# Patient Record
Sex: Male | Born: 1990
Health system: Southern US, Community
[De-identification: ages and names within clinical notes are randomized; demographics above are authoritative.]

## PROBLEM LIST (undated history)

## (undated) DIAGNOSIS — Z87820 Personal history of traumatic brain injury: Secondary | ICD-10-CM

## (undated) DIAGNOSIS — I82409 Acute embolism and thrombosis of unspecified deep veins of unspecified lower extremity: Secondary | ICD-10-CM

---
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
## 2016-07-16 ENCOUNTER — Emergency Department (HOSPITAL_COMMUNITY): Payer: No Typology Code available for payment source

## 2016-07-16 ENCOUNTER — Inpatient Hospital Stay (HOSPITAL_COMMUNITY)
Admission: EM | Admit: 2016-07-16 | Discharge: 2016-07-29 | DRG: 481 | Disposition: A | Discharge status: Home / Self Care | Payer: No Typology Code available for payment source | Attending: Orthopedic Surgery | Admitting: Orthopedic Surgery

## 2016-07-16 ENCOUNTER — Encounter (HOSPITAL_COMMUNITY): Payer: Self-pay | Admitting: *Deleted

## 2016-07-16 DIAGNOSIS — T1490XA Injury, unspecified, initial encounter: Secondary | ICD-10-CM

## 2016-07-16 DIAGNOSIS — M25462 Effusion, left knee: Secondary | ICD-10-CM | POA: Diagnosis present

## 2016-07-16 DIAGNOSIS — S7290XA Unspecified fracture of unspecified femur, initial encounter for closed fracture: Secondary | ICD-10-CM

## 2016-07-16 DIAGNOSIS — R52 Pain, unspecified: Secondary | ICD-10-CM

## 2016-07-16 DIAGNOSIS — S72352A Displaced comminuted fracture of shaft of left femur, initial encounter for closed fracture: Secondary | ICD-10-CM | POA: Diagnosis present

## 2016-07-16 DIAGNOSIS — I82621 Acute embolism and thrombosis of deep veins of right upper extremity: Secondary | ICD-10-CM | POA: Diagnosis present

## 2016-07-16 DIAGNOSIS — T148XXA Other injury of unspecified body region, initial encounter: Secondary | ICD-10-CM

## 2016-07-16 DIAGNOSIS — F17210 Nicotine dependence, cigarettes, uncomplicated: Secondary | ICD-10-CM | POA: Diagnosis present

## 2016-07-16 DIAGNOSIS — S069XAA Unspecified intracranial injury with loss of consciousness status unknown, initial encounter: Secondary | ICD-10-CM | POA: Diagnosis present

## 2016-07-16 DIAGNOSIS — R000 Tachycardia, unspecified: Secondary | ICD-10-CM | POA: Diagnosis present

## 2016-07-16 DIAGNOSIS — S76112A Strain of left quadriceps muscle, fascia and tendon, initial encounter: Secondary | ICD-10-CM | POA: Diagnosis present

## 2016-07-16 DIAGNOSIS — S7292XA Unspecified fracture of left femur, initial encounter for closed fracture: Secondary | ICD-10-CM

## 2016-07-16 DIAGNOSIS — Y92410 Unspecified street and highway as the place of occurrence of the external cause: Secondary | ICD-10-CM

## 2016-07-16 DIAGNOSIS — S069X9A Unspecified intracranial injury with loss of consciousness of unspecified duration, initial encounter: Secondary | ICD-10-CM | POA: Diagnosis present

## 2016-07-16 DIAGNOSIS — S7222XA Displaced subtrochanteric fracture of left femur, initial encounter for closed fracture: Secondary | ICD-10-CM | POA: Diagnosis not present

## 2016-07-16 DIAGNOSIS — Z86718 Personal history of other venous thrombosis and embolism: Secondary | ICD-10-CM

## 2016-07-16 DIAGNOSIS — M25552 Pain in left hip: Secondary | ICD-10-CM

## 2016-07-16 DIAGNOSIS — Z833 Family history of diabetes mellitus: Secondary | ICD-10-CM

## 2016-07-16 DIAGNOSIS — M25522 Pain in left elbow: Secondary | ICD-10-CM | POA: Diagnosis present

## 2016-07-16 DIAGNOSIS — I82409 Acute embolism and thrombosis of unspecified deep veins of unspecified lower extremity: Secondary | ICD-10-CM | POA: Diagnosis present

## 2016-07-16 DIAGNOSIS — M25529 Pain in unspecified elbow: Secondary | ICD-10-CM

## 2016-07-16 DIAGNOSIS — R339 Retention of urine, unspecified: Secondary | ICD-10-CM | POA: Diagnosis present

## 2016-07-16 DIAGNOSIS — D62 Acute posthemorrhagic anemia: Secondary | ICD-10-CM | POA: Diagnosis not present

## 2016-07-16 DIAGNOSIS — M25569 Pain in unspecified knee: Secondary | ICD-10-CM

## 2016-07-16 HISTORY — DX: Acute embolism and thrombosis of unspecified deep veins of unspecified lower extremity: I82.409

## 2016-07-16 HISTORY — DX: Personal history of traumatic brain injury: Z87.820

## 2016-07-16 LAB — I-STAT CG4 LACTIC ACID, ED: LACTIC ACID, VENOUS: 2.74 mmol/L — AB (ref 0.5–1.9)

## 2016-07-16 LAB — SAMPLE TO BLOOD BANK

## 2016-07-16 LAB — PROTIME-INR
INR: 1.05
Prothrombin Time: 13.7 seconds (ref 11.4–15.2)

## 2016-07-16 LAB — CDS SEROLOGY

## 2016-07-16 LAB — ETHANOL: Alcohol, Ethyl (B): <5 mg/dL (ref <5)

## 2016-07-16 MED ORDER — HYDROMORPHONE HCL 1 MG/ML IJ SOLN
INTRAMUSCULAR | Status: AC
Start: 2016-07-16 — End: 2016-07-16
  Administered 2016-07-16: 1 mg
  Filled 2016-07-16: qty 1

## 2016-07-16 MED ORDER — HYDROMORPHONE HCL 1 MG/ML IJ SOLN
INTRAMUSCULAR | Status: AC
  Filled 2016-07-16: qty 1

## 2016-07-16 MED ORDER — IOPAMIDOL (ISOVUE-300) INJECTION 61%
INTRAVENOUS | Status: AC
  Administered 2016-07-16: 100 mL
  Filled 2016-07-16: qty 100

## 2016-07-16 MED ORDER — HYDROMORPHONE HCL 1 MG/ML IJ SOLN
1.0000 mg | Freq: Once | INTRAMUSCULAR | Status: AC
  Administered 2016-07-16: 1 mg via INTRAVENOUS
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
## 2016-07-16 NOTE — ED Notes (Signed)
Pt has returned from CT with this RN
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
## 2016-07-17 ENCOUNTER — Emergency Department (HOSPITAL_COMMUNITY): Payer: No Typology Code available for payment source

## 2016-07-17 ENCOUNTER — Inpatient Hospital Stay (HOSPITAL_COMMUNITY): Payer: No Typology Code available for payment source

## 2016-07-17 ENCOUNTER — Inpatient Hospital Stay (HOSPITAL_COMMUNITY): Payer: No Typology Code available for payment source | Admitting: Anesthesiology

## 2016-07-17 ENCOUNTER — Encounter (HOSPITAL_COMMUNITY): Admission: EM | Disposition: A | Discharge status: Home / Self Care | Payer: Self-pay | Source: Home / Self Care

## 2016-07-17 ENCOUNTER — Encounter (HOSPITAL_COMMUNITY): Payer: Self-pay | Admitting: Anesthesiology

## 2016-07-17 DIAGNOSIS — M25522 Pain in left elbow: Secondary | ICD-10-CM | POA: Diagnosis present

## 2016-07-17 DIAGNOSIS — M25552 Pain in left hip: Secondary | ICD-10-CM | POA: Diagnosis present

## 2016-07-17 DIAGNOSIS — Z833 Family history of diabetes mellitus: Secondary | ICD-10-CM | POA: Diagnosis not present

## 2016-07-17 DIAGNOSIS — I82621 Acute embolism and thrombosis of deep veins of right upper extremity: Secondary | ICD-10-CM | POA: Diagnosis present

## 2016-07-17 DIAGNOSIS — S7222XA Displaced subtrochanteric fracture of left femur, initial encounter for closed fracture: Secondary | ICD-10-CM | POA: Diagnosis present

## 2016-07-17 DIAGNOSIS — D62 Acute posthemorrhagic anemia: Secondary | ICD-10-CM | POA: Diagnosis not present

## 2016-07-17 DIAGNOSIS — M7989 Other specified soft tissue disorders: Secondary | ICD-10-CM | POA: Diagnosis not present

## 2016-07-17 DIAGNOSIS — S72352A Displaced comminuted fracture of shaft of left femur, initial encounter for closed fracture: Secondary | ICD-10-CM | POA: Diagnosis present

## 2016-07-17 DIAGNOSIS — F17210 Nicotine dependence, cigarettes, uncomplicated: Secondary | ICD-10-CM | POA: Diagnosis present

## 2016-07-17 DIAGNOSIS — M25462 Effusion, left knee: Secondary | ICD-10-CM | POA: Diagnosis present

## 2016-07-17 DIAGNOSIS — E871 Hypo-osmolality and hyponatremia: Secondary | ICD-10-CM | POA: Diagnosis not present

## 2016-07-17 DIAGNOSIS — S76112A Strain of left quadriceps muscle, fascia and tendon, initial encounter: Secondary | ICD-10-CM | POA: Diagnosis present

## 2016-07-17 DIAGNOSIS — Z86718 Personal history of other venous thrombosis and embolism: Secondary | ICD-10-CM | POA: Diagnosis not present

## 2016-07-17 DIAGNOSIS — S069X1S Unspecified intracranial injury with loss of consciousness of 30 minutes or less, sequela: Secondary | ICD-10-CM | POA: Diagnosis not present

## 2016-07-17 DIAGNOSIS — R339 Retention of urine, unspecified: Secondary | ICD-10-CM | POA: Diagnosis present

## 2016-07-17 DIAGNOSIS — R000 Tachycardia, unspecified: Secondary | ICD-10-CM | POA: Diagnosis present

## 2016-07-17 DIAGNOSIS — S72102S Unspecified trochanteric fracture of left femur, sequela: Secondary | ICD-10-CM | POA: Diagnosis not present

## 2016-07-17 DIAGNOSIS — Y92410 Unspecified street and highway as the place of occurrence of the external cause: Secondary | ICD-10-CM | POA: Diagnosis not present

## 2016-07-17 DIAGNOSIS — S72002G Fracture of unspecified part of neck of left femur, subsequent encounter for closed fracture with delayed healing: Secondary | ICD-10-CM | POA: Diagnosis not present

## 2016-07-17 HISTORY — PX: FEMUR IM NAIL: SHX1597

## 2016-07-17 LAB — I-STAT CHEM 8, ED
BUN: 16 mg/dL (ref 6–20)
CALCIUM ION: 1.08 mmol/L — AB (ref 1.15–1.40)
CHLORIDE: 103 mmol/L (ref 101–111)
Creatinine, Ser: 1 mg/dL (ref 0.61–1.24)
Glucose, Bld: 95 mg/dL (ref 65–99)
HEMATOCRIT: 53 % — AB (ref 39.0–52.0)
Hemoglobin: 18 g/dL — ABNORMAL HIGH (ref 13.0–17.0)
Potassium: 3.4 mmol/L — ABNORMAL LOW (ref 3.5–5.1)
SODIUM: 140 mmol/L (ref 135–145)
TCO2: 24 mmol/L (ref 0–100)

## 2016-07-17 LAB — URINALYSIS, ROUTINE W REFLEX MICROSCOPIC
Bilirubin Urine: NEGATIVE
GLUCOSE, UA: NEGATIVE mg/dL
Ketones, ur: NEGATIVE mg/dL
LEUKOCYTES UA: NEGATIVE
Nitrite: NEGATIVE
PH: 5.5 (ref 5.0–8.0)
Protein, ur: NEGATIVE mg/dL
Specific Gravity, Urine: 1.02 (ref 1.005–1.030)

## 2016-07-17 LAB — COMPREHENSIVE METABOLIC PANEL
ALBUMIN: 4.4 g/dL (ref 3.5–5.0)
ALT: 28 U/L (ref 17–63)
ANION GAP: 10 (ref 5–15)
AST: 42 U/L — ABNORMAL HIGH (ref 15–41)
Alkaline Phosphatase: 59 U/L (ref 38–126)
BUN: 14 mg/dL (ref 6–20)
CHLORIDE: 104 mmol/L (ref 101–111)
CO2: 24 mmol/L (ref 22–32)
CREATININE: 1.03 mg/dL (ref 0.61–1.24)
Calcium: 9.5 mg/dL (ref 8.9–10.3)
GFR calc non Af Amer: >60 mL/min (ref >60)
GLUCOSE: 95 mg/dL (ref 65–99)
Potassium: 3.7 mmol/L (ref 3.5–5.1)
SODIUM: 138 mmol/L (ref 135–145)
Total Bilirubin: 1 mg/dL (ref 0.3–1.2)
Total Protein: 7.6 g/dL (ref 6.5–8.1)

## 2016-07-17 LAB — SURGICAL PCR SCREEN
MRSA, PCR: NEGATIVE
STAPHYLOCOCCUS AUREUS: POSITIVE — AB

## 2016-07-17 LAB — CBC
HCT: 51.8 % (ref 39.0–52.0)
HEMOGLOBIN: 17.5 g/dL — AB (ref 13.0–17.0)
MCH: 30.7 pg (ref 26.0–34.0)
MCHC: 33.8 g/dL (ref 30.0–36.0)
MCV: 90.9 fL (ref 78.0–100.0)
Platelets: 254 10*3/uL (ref 150–400)
RBC: 5.7 MIL/uL (ref 4.22–5.81)
RDW: 13.4 % (ref 11.5–15.5)
WBC: 26 10*3/uL — ABNORMAL HIGH (ref 4.0–10.5)

## 2016-07-17 LAB — URINE MICROSCOPIC-ADD ON
RBC / HPF: NONE SEEN RBC/hpf (ref 0–5)
WBC, UA: NONE SEEN WBC/hpf (ref 0–5)

## 2016-07-17 SURGERY — INSERTION, INTRAMEDULLARY ROD, FEMUR
Anesthesia: General | Site: Leg Upper | Laterality: Left

## 2016-07-17 MED ORDER — SUCCINYLCHOLINE CHLORIDE 200 MG/10ML IV SOSY
PREFILLED_SYRINGE | INTRAVENOUS | Status: AC
  Filled 2016-07-17: qty 10

## 2016-07-17 MED ORDER — METOCLOPRAMIDE HCL 5 MG/ML IJ SOLN: 5.0000 mg | Freq: Three times a day (TID) | INTRAMUSCULAR | Status: DC | PRN

## 2016-07-17 MED ORDER — ENOXAPARIN SODIUM 40 MG/0.4ML ~~LOC~~ SOLN
40.0000 mg | SUBCUTANEOUS | Status: DC
  Administered 2016-07-18 – 2016-07-20 (×3): 40 mg via SUBCUTANEOUS
  Filled 2016-07-17 (×4): qty 0.4

## 2016-07-17 MED ORDER — ACETAMINOPHEN 325 MG PO TABS
650.0000 mg | ORAL_TABLET | ORAL | Status: DC | PRN
  Administered 2016-07-17 – 2016-07-21 (×5): 650 mg via ORAL
  Filled 2016-07-17 (×6): qty 2

## 2016-07-17 MED ORDER — METOCLOPRAMIDE HCL 5 MG PO TABS: 5.0000 mg | ORAL_TABLET | Freq: Three times a day (TID) | ORAL | Status: DC | PRN

## 2016-07-17 MED ORDER — PHENYLEPHRINE 40 MCG/ML (10ML) SYRINGE FOR IV PUSH (FOR BLOOD PRESSURE SUPPORT)
PREFILLED_SYRINGE | INTRAVENOUS | Status: AC
  Filled 2016-07-17: qty 10

## 2016-07-17 MED ORDER — OXYCODONE HCL 5 MG PO TABS
5.0000 mg | ORAL_TABLET | ORAL | Status: DC | PRN
  Administered 2016-07-17 (×2): 10 mg via ORAL
  Filled 2016-07-17 (×2): qty 2

## 2016-07-17 MED ORDER — DOCUSATE SODIUM 100 MG PO CAPS
100.0000 mg | ORAL_CAPSULE | Freq: Two times a day (BID) | ORAL | Status: DC
  Administered 2016-07-17 – 2016-07-26 (×19): 100 mg via ORAL
  Filled 2016-07-17 (×19): qty 1

## 2016-07-17 MED ORDER — HYDROMORPHONE HCL 1 MG/ML IJ SOLN
0.5000 mg | INTRAMUSCULAR | Status: DC | PRN
  Administered 2016-07-17 (×2): 1 mg via INTRAVENOUS
  Administered 2016-07-17: 2 mg via INTRAVENOUS
  Administered 2016-07-17 (×3): 1 mg via INTRAVENOUS
  Administered 2016-07-17 – 2016-07-18 (×2): 2 mg via INTRAVENOUS
  Administered 2016-07-18: 1 mg via INTRAVENOUS
  Administered 2016-07-18 (×2): 2 mg via INTRAVENOUS
  Filled 2016-07-17 (×3): qty 1
  Filled 2016-07-17 (×2): qty 2
  Filled 2016-07-17: qty 1
  Filled 2016-07-17: qty 2
  Filled 2016-07-17: qty 1
  Filled 2016-07-17 (×2): qty 2
  Filled 2016-07-17: qty 1

## 2016-07-17 MED ORDER — LIDOCAINE 2% (20 MG/ML) 5 ML SYRINGE
INTRAMUSCULAR | Status: AC
  Filled 2016-07-17: qty 5

## 2016-07-17 MED ORDER — HYDROCODONE-ACETAMINOPHEN 7.5-325 MG PO TABS: 1.0000 | ORAL_TABLET | Freq: Once | ORAL | Status: DC | PRN

## 2016-07-17 MED ORDER — KETAMINE HCL-SODIUM CHLORIDE 100-0.9 MG/10ML-% IV SOSY
0.3000 mg/kg | PREFILLED_SYRINGE | Freq: Once | INTRAVENOUS | Status: AC
  Administered 2016-07-17: 19 mg via INTRAVENOUS
  Filled 2016-07-17: qty 10

## 2016-07-17 MED ORDER — PHENYLEPHRINE HCL 10 MG/ML IJ SOLN
INTRAMUSCULAR | Status: DC | PRN
  Administered 2016-07-17 (×4): 40 ug via INTRAVENOUS
  Administered 2016-07-17: 80 ug via INTRAVENOUS
  Administered 2016-07-17: 40 ug via INTRAVENOUS

## 2016-07-17 MED ORDER — BISACODYL 10 MG RE SUPP
10.0000 mg | Freq: Every day | RECTAL | Status: DC | PRN
  Filled 2016-07-17: qty 1

## 2016-07-17 MED ORDER — MIDAZOLAM HCL 5 MG/5ML IJ SOLN
INTRAMUSCULAR | Status: DC | PRN
  Administered 2016-07-17: 1 mg via INTRAVENOUS

## 2016-07-17 MED ORDER — LIDOCAINE HCL (CARDIAC) 20 MG/ML IV SOLN
INTRAVENOUS | Status: DC | PRN
  Administered 2016-07-17: 60 mg via INTRAVENOUS

## 2016-07-17 MED ORDER — ONDANSETRON HCL 4 MG PO TABS: 4.0000 mg | ORAL_TABLET | Freq: Four times a day (QID) | ORAL | Status: DC | PRN

## 2016-07-17 MED ORDER — PHENOL 1.4 % MT LIQD: 1.0000 | OROMUCOSAL | Status: DC | PRN

## 2016-07-17 MED ORDER — ONDANSETRON HCL 4 MG/2ML IJ SOLN
INTRAMUSCULAR | Status: DC | PRN
  Administered 2016-07-17: 4 mg via INTRAVENOUS

## 2016-07-17 MED ORDER — KCL IN DEXTROSE-NACL 20-5-0.45 MEQ/L-%-% IV SOLN
INTRAVENOUS | Status: DC
  Administered 2016-07-17 – 2016-07-18 (×4): via INTRAVENOUS
  Filled 2016-07-17 (×3): qty 1000

## 2016-07-17 MED ORDER — FENTANYL CITRATE (PF) 100 MCG/2ML IJ SOLN
INTRAMUSCULAR | Status: AC
  Filled 2016-07-17: qty 4

## 2016-07-17 MED ORDER — CEFAZOLIN SODIUM-DEXTROSE 2-4 GM/100ML-% IV SOLN
2.0000 g | Freq: Four times a day (QID) | INTRAVENOUS | Status: AC
  Administered 2016-07-17 (×2): 2 g via INTRAVENOUS
  Filled 2016-07-17 (×2): qty 100

## 2016-07-17 MED ORDER — PROPOFOL 10 MG/ML IV BOLUS
INTRAVENOUS | Status: AC
  Filled 2016-07-17: qty 40

## 2016-07-17 MED ORDER — HYDROMORPHONE HCL 1 MG/ML IJ SOLN
1.0000 mg | Freq: Once | INTRAMUSCULAR | Status: AC
  Administered 2016-07-17: 1 mg via INTRAVENOUS
  Filled 2016-07-17: qty 1

## 2016-07-17 MED ORDER — ONDANSETRON HCL 4 MG/2ML IJ SOLN: 4.0000 mg | Freq: Four times a day (QID) | INTRAMUSCULAR | Status: DC | PRN

## 2016-07-17 MED ORDER — ROCURONIUM BROMIDE 10 MG/ML (PF) SYRINGE
PREFILLED_SYRINGE | INTRAVENOUS | Status: AC
  Filled 2016-07-17: qty 10

## 2016-07-17 MED ORDER — CHLORHEXIDINE GLUCONATE 4 % EX LIQD
60.0000 mL | Freq: Once | CUTANEOUS | Status: AC
  Administered 2016-07-17: 4 via TOPICAL
  Filled 2016-07-17 (×2): qty 60

## 2016-07-17 MED ORDER — CEFAZOLIN SODIUM-DEXTROSE 2-4 GM/100ML-% IV SOLN
2.0000 g | INTRAVENOUS | Status: AC
  Administered 2016-07-17: 2 g via INTRAVENOUS
  Filled 2016-07-17 (×2): qty 100

## 2016-07-17 MED ORDER — PROMETHAZINE HCL 25 MG/ML IJ SOLN: 6.2500 mg | INTRAMUSCULAR | Status: DC | PRN

## 2016-07-17 MED ORDER — SUGAMMADEX SODIUM 200 MG/2ML IV SOLN
INTRAVENOUS | Status: DC | PRN
  Administered 2016-07-17: 130 mg via INTRAVENOUS

## 2016-07-17 MED ORDER — HYDROMORPHONE HCL 1 MG/ML IJ SOLN
0.2500 mg | INTRAMUSCULAR | Status: DC | PRN
  Administered 2016-07-17 (×2): 0.5 mg via INTRAVENOUS

## 2016-07-17 MED ORDER — FENTANYL CITRATE (PF) 100 MCG/2ML IJ SOLN
INTRAMUSCULAR | Status: DC | PRN
  Administered 2016-07-17 (×2): 50 ug via INTRAVENOUS
  Administered 2016-07-17: 100 ug via INTRAVENOUS
  Administered 2016-07-17: 50 ug via INTRAVENOUS

## 2016-07-17 MED ORDER — ROCURONIUM BROMIDE 100 MG/10ML IV SOLN
INTRAVENOUS | Status: DC | PRN
  Administered 2016-07-17: 10 mg via INTRAVENOUS
  Administered 2016-07-17: 40 mg via INTRAVENOUS

## 2016-07-17 MED ORDER — SUCCINYLCHOLINE CHLORIDE 20 MG/ML IJ SOLN
INTRAMUSCULAR | Status: DC | PRN
  Administered 2016-07-17: 100 mg via INTRAVENOUS

## 2016-07-17 MED ORDER — HYDROMORPHONE HCL 1 MG/ML IJ SOLN
INTRAMUSCULAR | Status: AC
  Administered 2016-07-17: 1 mg
  Filled 2016-07-17: qty 1

## 2016-07-17 MED ORDER — PROPOFOL 10 MG/ML IV BOLUS
INTRAVENOUS | Status: DC | PRN
  Administered 2016-07-17: 160 mg via INTRAVENOUS

## 2016-07-17 MED ORDER — POVIDONE-IODINE 10 % EX SWAB: 2.0000 "application " | Freq: Once | CUTANEOUS | Status: DC

## 2016-07-17 MED ORDER — LACTATED RINGERS IV SOLN
INTRAVENOUS | Status: DC | PRN
  Administered 2016-07-17 (×2): via INTRAVENOUS

## 2016-07-17 MED ORDER — MENTHOL 3 MG MT LOZG
1.0000 | LOZENGE | OROMUCOSAL | Status: DC | PRN
  Filled 2016-07-17: qty 9

## 2016-07-17 MED ORDER — HYDROMORPHONE HCL 1 MG/ML IJ SOLN
INTRAMUSCULAR | Status: AC
  Administered 2016-07-17: 0.5 mg via INTRAVENOUS
  Filled 2016-07-17: qty 1

## 2016-07-17 MED ORDER — FENTANYL CITRATE (PF) 100 MCG/2ML IJ SOLN
INTRAMUSCULAR | Status: AC
  Filled 2016-07-17: qty 2

## 2016-07-17 MED ORDER — ONDANSETRON HCL 4 MG/2ML IJ SOLN
INTRAMUSCULAR | Status: AC
  Filled 2016-07-17: qty 2

## 2016-07-17 MED ORDER — CEFAZOLIN SODIUM-DEXTROSE 2-4 GM/100ML-% IV SOLN
2.0000 g | Freq: Every day | INTRAVENOUS | Status: DC
  Filled 2016-07-17: qty 100

## 2016-07-17 MED ORDER — SUGAMMADEX SODIUM 200 MG/2ML IV SOLN
INTRAVENOUS | Status: AC
  Filled 2016-07-17: qty 2

## 2016-07-17 MED ORDER — MIDAZOLAM HCL 2 MG/2ML IJ SOLN
INTRAMUSCULAR | Status: AC
  Filled 2016-07-17: qty 2

## 2016-07-17 SURGICAL SUPPLY — 48 items
BIT DRILL 3.8X6 NS (BIT) ×3 IMPLANT
BIT DRILL 5.3 (BIT) ×3 IMPLANT
BIT DRILL 6.5X4.8 (BIT) ×3 IMPLANT
BNDG COHESIVE 4X5 TAN STRL (GAUZE/BANDAGES/DRESSINGS) ×3 IMPLANT
BNDG GAUZE ELAST 4 BULKY (GAUZE/BANDAGES/DRESSINGS) ×3 IMPLANT
CHLORAPREP W/TINT 26ML (MISCELLANEOUS) ×3 IMPLANT
COVER PERINEAL POST (MISCELLANEOUS) ×3 IMPLANT
COVER SURGICAL LIGHT HANDLE (MISCELLANEOUS) ×3 IMPLANT
DRAPE INCISE IOBAN 66X45 STRL (DRAPES) ×3 IMPLANT
DRAPE STERI IOBAN 125X83 (DRAPES) ×3 IMPLANT
DRAPE SURG 17X23 STRL (DRAPES) ×12 IMPLANT
DRSG MEPILEX BORDER 4X4 (GAUZE/BANDAGES/DRESSINGS) ×6 IMPLANT
DRSG MEPILEX BORDER 4X8 (GAUZE/BANDAGES/DRESSINGS) ×3 IMPLANT
DRSG PAD ABDOMINAL 8X10 ST (GAUZE/BANDAGES/DRESSINGS) ×3 IMPLANT
ELECT REM PT RETURN 9FT ADLT (ELECTROSURGICAL) ×3
ELECTRODE REM PT RTRN 9FT ADLT (ELECTROSURGICAL) ×1 IMPLANT
GAUZE XEROFORM 5X9 LF (GAUZE/BANDAGES/DRESSINGS) ×3 IMPLANT
GLOVE BIO SURGEON STRL SZ7 (GLOVE) ×3 IMPLANT
GLOVE BIO SURGEON STRL SZ7.5 (GLOVE) ×3 IMPLANT
GLOVE BIOGEL PI IND STRL 8 (GLOVE) ×1 IMPLANT
GLOVE BIOGEL PI INDICATOR 8 (GLOVE) ×2
GOWN STRL REUS W/ TWL LRG LVL3 (GOWN DISPOSABLE) ×2 IMPLANT
GOWN STRL REUS W/TWL LRG LVL3 (GOWN DISPOSABLE) ×4
GUIDEPIN 3.2X17.5 THRD DISP (PIN) ×9 IMPLANT
GUIDEWIRE BALL NOSE 80CM (WIRE) ×3 IMPLANT
KIT ROOM TURNOVER OR (KITS) ×3 IMPLANT
LINER BOOT UNIVERSAL DISP (MISCELLANEOUS) ×3 IMPLANT
MANIFOLD NEPTUNE II (INSTRUMENTS) ×3 IMPLANT
NAIL TROCH ENTRY 9MMX36CM (Nail) ×3 IMPLANT
NS IRRIG 1000ML POUR BTL (IV SOLUTION) ×3 IMPLANT
PACK GENERAL/GYN (CUSTOM PROCEDURE TRAY) ×3 IMPLANT
PAD ARMBOARD 7.5X6 YLW CONV (MISCELLANEOUS) ×6 IMPLANT
SCREW ACECAP 40MM (Screw) ×3 IMPLANT
SCREW ACECAP 44MM (Screw) ×3 IMPLANT
SCREW CORT BONE 4.5X50 1402250 (Screw) ×6 IMPLANT
SCREW LAG 6.5MMX85MM (Screw) ×3 IMPLANT
SCREW LAG 6.5X80MM (Screw) ×3 IMPLANT
SCREWDRIVER HEX TIP 3.5MM (MISCELLANEOUS) ×3 IMPLANT
SPONGE GAUZE 4X4 12PLY STER LF (GAUZE/BANDAGES/DRESSINGS) ×3 IMPLANT
STAPLER VISISTAT 35W (STAPLE) ×3 IMPLANT
SUT VIC AB 1 CT1 27 (SUTURE) ×2
SUT VIC AB 1 CT1 27XBRD ANBCTR (SUTURE) ×1 IMPLANT
SUT VIC AB 2-0 CT1 27 (SUTURE) ×2
SUT VIC AB 2-0 CT1 TAPERPNT 27 (SUTURE) ×1 IMPLANT
SYR BULB IRRIGATION 50ML (SYRINGE) ×3 IMPLANT
TOWEL OR 17X24 6PK STRL BLUE (TOWEL DISPOSABLE) ×3 IMPLANT
TOWEL OR 17X26 10 PK STRL BLUE (TOWEL DISPOSABLE) ×3 IMPLANT
WATER STERILE IRR 1000ML POUR (IV SOLUTION) ×3 IMPLANT
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
## 2016-07-17 NOTE — Progress Notes (Signed)
Unable to void prior to surgery.  Noted on pre op checklist.

## 2016-07-17 NOTE — Anesthesia Procedure Notes (Signed)
Procedure Name: Intubation Date/Time: 07/17/2016 7:40 AM Performed by: Marni GriffonJAMES, Adonay Scheier B Pre-anesthesia Checklist: Patient identified, Emergency Drugs available, Suction available and Patient being monitored Patient Re-evaluated:Patient Re-evaluated prior to inductionOxygen Delivery Method: Circle system utilized Preoxygenation: Pre-oxygenation with 100% oxygen Intubation Type: IV induction Ventilation: Mask ventilation without difficulty Laryngoscope Size: Glidescope and 4 Grade View: Grade I Tube type: Oral Tube size: 7.5 mm Number of attempts: 1 Airway Equipment and Method: Video-laryngoscopy and Rigid stylet Placement Confirmation: ETT inserted through vocal cords under direct vision,  positive ETCO2 and breath sounds checked- equal and bilateral Secured at: 21 (cm at teeth) cm Tube secured with: Tape Dental Injury: Teeth and Oropharynx as per pre-operative assessment  Comments: C-Collar remained in place during video laryngoscopy

## 2016-07-17 NOTE — Anesthesia Postprocedure Evaluation (Signed)
Anesthesia Post Note  Patient: Raymond Brady  Procedure(s) Performed: Procedure(s) (LRB): INTRAMEDULLARY (IM) NAIL FEMORAL (Left)  Patient location during evaluation: PACU Anesthesia Type: General Level of consciousness: awake and alert Pain management: pain level controlled Vital Signs Assessment: post-procedure vital signs reviewed and stable Respiratory status: spontaneous breathing, nonlabored ventilation, respiratory function stable and patient connected to nasal cannula oxygen Cardiovascular status: blood pressure returned to baseline and stable Postop Assessment: no signs of nausea or vomiting Anesthetic complications: no    Last Vitals:  Vitals:   07/17/16 1133 07/17/16 1140  BP: 136/87   Pulse: (!) 124 (!) 106  Resp: 17 15  Temp:  36.9 C    Last Pain:  Vitals:   07/17/16 1140  TempSrc:   PainSc: 10-Worst pain ever                 Kennieth RadFitzgerald, Lexus Shampine E
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
## 2016-07-17 NOTE — Transfer of Care (Signed)
Immediate Anesthesia Transfer of Care Note  Patient: Raymond [name] Brady  Procedure(s) Performed: Procedure(s): INTRAMEDULLARY (IM) NAIL FEMORAL (Left)  Patient Location: PACU  Anesthesia Type:General  Level of Consciousness: awake and patient cooperative  Airway & Oxygen Therapy: Patient Spontanous Breathing and Patient connected to nasal cannula oxygen  Post-op Assessment: Report given to RN, Post -op Vital signs reviewed and stable and Patient moving all extremities  Post vital signs: Reviewed and stable  Last Vitals:  Vitals:   07/17/16 0345 07/17/16 0447  BP: 129/85 132/85  Pulse: 108 (!) 109  Resp: 15 16  Temp:  36.7 C    Last Pain:  Vitals:   07/17/16 0509  TempSrc:   PainSc: Asleep      Patients Stated Pain Goal: 2 (07/17/16 0440)  Complications: No apparent anesthesia complications

## 2016-07-17 NOTE — Progress Notes (Signed)
Orthopedic Tech Progress Note Patient Details:  Raymond Brady [birth year] [id number]  Musculoskeletal Traction Type of Traction: Bucks Skin Traction Traction Location: lle Traction Weight: 10 lbs    [name], [name] 07/17/2016, 1:42 AM

## 2016-07-17 NOTE — Progress Notes (Signed)
Spoke to Dr Magnus IvanBlackman, ok for patient to take off neck brace, denies any pain to his neck, ct of c spine negative

## 2016-07-17 NOTE — Progress Notes (Signed)
Paged Dr Magnus IvanBlackman, family asking for drug screen and when he will be able to take the neck brace off
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
## 2016-07-17 NOTE — Anesthesia Preprocedure Evaluation (Addendum)
Anesthesia Evaluation  Patient identified by MRN, date of birth, ID band Patient awake    Reviewed: Allergy & Precautions, NPO status , Patient's Chart, lab work & pertinent test results  Airway Mallampati: III  TM Distance: >3 FB Neck ROM: Full   Comment: c-collar in place Dental  (+) Dental Advisory Given, Teeth Intact   Pulmonary Current Smoker,    breath sounds clear to auscultation       Cardiovascular negative cardio ROS   Rhythm:Regular Rate:Normal     Neuro/Psych negative neurological ROS     GI/Hepatic negative GI ROS, Neg liver ROS,   Endo/Other  negative endocrine ROS  Renal/GU negative Renal ROS     Musculoskeletal   Abdominal   Peds  Hematology negative hematology ROS (+)   Anesthesia Other Findings C-Collar in place.  Pt able to open jaw within limits of collar.  Reproductive/Obstetrics                           Lab Results  Component Value Date   WBC 26.0 (H) 07/16/2016   HGB 18.0 (H) 07/16/2016   HCT 53.0 (H) 07/16/2016   MCV 90.9 07/16/2016   PLT 254 07/16/2016   Lab Results  Component Value Date   CREATININE 1.00 07/16/2016   BUN 16 07/16/2016   NA 140 07/16/2016   K 3.4 (L) 07/16/2016   CL 103 07/16/2016   CO2 24 07/16/2016    Anesthesia Physical Anesthesia Plan  ASA: II  Anesthesia Plan: General   Post-op Pain Management:    Induction: Intravenous  Airway Management Planned: Oral ETT  Additional Equipment:   Intra-op Plan:   Post-operative Plan: Extubation in OR  Informed Consent: I have reviewed the patients History and Physical, chart, labs and discussed the procedure including the risks, benefits and alternatives for the proposed anesthesia with the patient or authorized representative who has indicated his/her understanding and acceptance.   Dental advisory given  Plan Discussed with: CRNA  Anesthesia Plan Comments:          Anesthesia Quick Evaluation

## 2016-07-18 ENCOUNTER — Encounter (HOSPITAL_COMMUNITY): Payer: Self-pay | Admitting: Orthopedic Surgery

## 2016-07-18 DIAGNOSIS — S7292XA Unspecified fracture of left femur, initial encounter for closed fracture: Secondary | ICD-10-CM | POA: Diagnosis present

## 2016-07-18 DIAGNOSIS — R339 Retention of urine, unspecified: Secondary | ICD-10-CM | POA: Diagnosis not present

## 2016-07-18 DIAGNOSIS — D62 Acute posthemorrhagic anemia: Secondary | ICD-10-CM | POA: Diagnosis not present

## 2016-07-18 LAB — BASIC METABOLIC PANEL
Anion gap: 6 (ref 5–15)
BUN: 6 mg/dL (ref 6–20)
CALCIUM: 8 mg/dL — AB (ref 8.9–10.3)
CHLORIDE: 99 mmol/L — AB (ref 101–111)
CO2: 27 mmol/L (ref 22–32)
CREATININE: 0.83 mg/dL (ref 0.61–1.24)
GFR calc non Af Amer: >60 mL/min (ref >60)
Glucose, Bld: 128 mg/dL — ABNORMAL HIGH (ref 65–99)
Potassium: 3.5 mmol/L (ref 3.5–5.1)
SODIUM: 132 mmol/L — AB (ref 135–145)

## 2016-07-18 LAB — CBC
HCT: 33.8 % — ABNORMAL LOW (ref 39.0–52.0)
HEMOGLOBIN: 11.3 g/dL — AB (ref 13.0–17.0)
MCH: 29.8 pg (ref 26.0–34.0)
MCHC: 33.4 g/dL (ref 30.0–36.0)
MCV: 89.2 fL (ref 78.0–100.0)
Platelets: 189 10*3/uL (ref 150–400)
RBC: 3.79 MIL/uL — ABNORMAL LOW (ref 4.22–5.81)
RDW: 13.5 % (ref 11.5–15.5)
WBC: 12.3 10*3/uL — ABNORMAL HIGH (ref 4.0–10.5)

## 2016-07-18 MED ORDER — TAMSULOSIN HCL 0.4 MG PO CAPS
0.4000 mg | ORAL_CAPSULE | Freq: Every day | ORAL | Status: DC
  Administered 2016-07-18 – 2016-07-19 (×2): 0.4 mg via ORAL
  Filled 2016-07-18 (×2): qty 1

## 2016-07-18 MED ORDER — BETHANECHOL CHLORIDE 25 MG PO TABS
25.0000 mg | ORAL_TABLET | Freq: Four times a day (QID) | ORAL | Status: DC
  Administered 2016-07-18 – 2016-07-19 (×8): 25 mg via ORAL
  Filled 2016-07-18 (×8): qty 1

## 2016-07-18 MED ORDER — HYDROMORPHONE HCL 1 MG/ML IJ SOLN
0.5000 mg | INTRAMUSCULAR | Status: DC | PRN
  Administered 2016-07-18 – 2016-07-21 (×14): 0.5 mg via INTRAVENOUS
  Filled 2016-07-18 (×15): qty 1

## 2016-07-18 MED ORDER — TRAMADOL HCL 50 MG PO TABS
100.0000 mg | ORAL_TABLET | Freq: Four times a day (QID) | ORAL | Status: DC
  Administered 2016-07-18 – 2016-07-29 (×41): 100 mg via ORAL
  Filled 2016-07-18 (×42): qty 2

## 2016-07-18 MED ORDER — POLYETHYLENE GLYCOL 3350 17 G PO PACK
17.0000 g | PACK | Freq: Every day | ORAL | Status: DC
  Administered 2016-07-18 – 2016-07-26 (×9): 17 g via ORAL
  Filled 2016-07-18 (×9): qty 1

## 2016-07-18 MED ORDER — DIPHENHYDRAMINE HCL 50 MG/ML IJ SOLN
25.0000 mg | Freq: Four times a day (QID) | INTRAMUSCULAR | Status: DC | PRN
  Administered 2016-07-18 – 2016-07-19 (×2): 25 mg via INTRAVENOUS
  Filled 2016-07-18 (×2): qty 1

## 2016-07-18 MED ORDER — OXYCODONE HCL 5 MG PO TABS
10.0000 mg | ORAL_TABLET | ORAL | Status: DC | PRN
  Administered 2016-07-18 (×4): 20 mg via ORAL
  Administered 2016-07-19 (×2): 15 mg via ORAL
  Administered 2016-07-19: 20 mg via ORAL
  Administered 2016-07-19: 15 mg via ORAL
  Administered 2016-07-19: 20 mg via ORAL
  Administered 2016-07-20 (×5): 15 mg via ORAL
  Administered 2016-07-21: 20 mg via ORAL
  Administered 2016-07-21 (×2): 15 mg via ORAL
  Filled 2016-07-18 (×2): qty 4
  Filled 2016-07-18: qty 3
  Filled 2016-07-18: qty 4
  Filled 2016-07-18 (×4): qty 3
  Filled 2016-07-18 (×3): qty 4
  Filled 2016-07-18: qty 3
  Filled 2016-07-18: qty 4
  Filled 2016-07-18: qty 3
  Filled 2016-07-18: qty 4
  Filled 2016-07-18 (×3): qty 3
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
## 2016-07-18 NOTE — Progress Notes (Signed)
Orthopedic Tech Progress Note Patient Details:  Raymond [name] Brady 1991/05/07 [id number]  Ortho Devices Ortho Device/Splint Location: trapeze bar patient helper   Nikki DomCrawford, Ivor Kishi 07/18/2016, 8:53 AM

## 2016-07-18 NOTE — Progress Notes (Signed)
Md on call notified that pt fever 102.4 HR 116 new order to modify Tylenol to include fevers. Will continue to monitor. Ilean SkillVeronica Jaimya Feliciano LPN

## 2016-07-18 NOTE — Progress Notes (Signed)
PT Cancellation Note  Patient Details Name: Raymond [name] Woodring MRN: [id number] DOB: 13-Apr-1991   Cancelled Treatment:    Reason Eval/Treat Not Completed: Patient not medically ready Pt on bedrest. Please increase activity orders prior to initiation of PT evaluation.   Blake DivineShauna A Amberlie Gaillard 07/18/2016, 8:32 AM Mylo RedShauna Shakthi Scipio, PT, DPT [phone]
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
## 2016-07-18 NOTE — Progress Notes (Signed)
Per protocol, foley catheter inserted for urinary retention.
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
## 2016-07-19 ENCOUNTER — Inpatient Hospital Stay (HOSPITAL_COMMUNITY): Payer: No Typology Code available for payment source

## 2016-07-19 LAB — CBC
HCT: 29.1 % — ABNORMAL LOW (ref 39.0–52.0)
Hemoglobin: 9.6 g/dL — ABNORMAL LOW (ref 13.0–17.0)
MCH: 29.1 pg (ref 26.0–34.0)
MCHC: 33 g/dL (ref 30.0–36.0)
MCV: 88.2 fL (ref 78.0–100.0)
PLATELETS: 180 10*3/uL (ref 150–400)
RBC: 3.3 MIL/uL — ABNORMAL LOW (ref 4.22–5.81)
RDW: 13.3 % (ref 11.5–15.5)
WBC: 11.1 10*3/uL — ABNORMAL HIGH (ref 4.0–10.5)

## 2016-07-19 MED ORDER — METOPROLOL TARTRATE 5 MG/5ML IV SOLN
5.0000 mg | Freq: Once | INTRAVENOUS | Status: AC
  Administered 2016-07-19: 5 mg via INTRAVENOUS
  Filled 2016-07-19: qty 5
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
## 2016-07-19 NOTE — Progress Notes (Signed)
MD on call notified that patient HR resting is 120-130 new order received, Ilean SkillVeronica Chalisa Kobler LPN

## 2016-07-19 NOTE — Progress Notes (Signed)
Pt received dilaudid 0.5 mg IV at 1055 and oxycodone 20 mg at approx 0730. Pt very sleepy and arousable only with moderate stimulation but not to verbal stimulation. Family in room at bedside. Continous pulse ox placed on pt during night shift due to amount of pain medications and pt becoming very somnolent after oxycodone and IV dilaudid per night RN rept.  Pt oxygen saturation 93% on RA at this time. Pt's girlfriend states that pt is "rarely awake". She states "he sleeps all the time". Will rept to MD/PA. Pt is demanding to go outside to smoke when he is awake.
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
## 2016-07-20 ENCOUNTER — Ambulatory Visit (HOSPITAL_COMMUNITY): Payer: No Typology Code available for payment source

## 2016-07-20 DIAGNOSIS — S76112A Strain of left quadriceps muscle, fascia and tendon, initial encounter: Secondary | ICD-10-CM | POA: Diagnosis present

## 2016-07-20 DIAGNOSIS — Z86718 Personal history of other venous thrombosis and embolism: Secondary | ICD-10-CM

## 2016-07-20 DIAGNOSIS — M7989 Other specified soft tissue disorders: Secondary | ICD-10-CM

## 2016-07-20 LAB — CBC
HEMATOCRIT: 27.5 % — AB (ref 39.0–52.0)
Hemoglobin: 9.3 g/dL — ABNORMAL LOW (ref 13.0–17.0)
MCH: 29.4 pg (ref 26.0–34.0)
MCHC: 33.8 g/dL (ref 30.0–36.0)
MCV: 87 fL (ref 78.0–100.0)
Platelets: 180 10*3/uL (ref 150–400)
RBC: 3.16 MIL/uL — ABNORMAL LOW (ref 4.22–5.81)
RDW: 13.2 % (ref 11.5–15.5)
WBC: 8.4 10*3/uL (ref 4.0–10.5)

## 2016-07-20 LAB — BASIC METABOLIC PANEL
Anion gap: 6 (ref 5–15)
BUN: <5 mg/dL — ABNORMAL LOW (ref 6–20)
CALCIUM: 8.2 mg/dL — AB (ref 8.9–10.3)
CHLORIDE: 97 mmol/L — AB (ref 101–111)
CO2: 29 mmol/L (ref 22–32)
CREATININE: 0.69 mg/dL (ref 0.61–1.24)
GFR calc Af Amer: >60 mL/min (ref >60)
GFR calc non Af Amer: >60 mL/min (ref >60)
GLUCOSE: 104 mg/dL — AB (ref 65–99)
Potassium: 3.6 mmol/L (ref 3.5–5.1)
Sodium: 132 mmol/L — ABNORMAL LOW (ref 135–145)

## 2016-07-20 MED ORDER — OXYCODONE HCL ER 10 MG PO T12A
10.0000 mg | EXTENDED_RELEASE_TABLET | Freq: Two times a day (BID) | ORAL | Status: DC
  Administered 2016-07-20 (×2): 10 mg via ORAL
  Filled 2016-07-20 (×2): qty 1
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
## 2016-07-20 NOTE — Progress Notes (Signed)
Dr Dwain SarnaWakefield notified of doppler results no new orders at this.  Cyla Haluska, Kae HellerMiranda Lynn, RN

## 2016-07-20 NOTE — Progress Notes (Signed)
Orthopedic Tech Progress Note Patient Details:  Raymond [name] Brady Oct 26, 1990 [id number]  Ortho Devices Type of Ortho Device: Knee Immobilizer Ortho Device/Splint Location: lle Ortho Device/Splint Interventions: Application   Raymond Brady 07/20/2016, 10:12 AM

## 2016-07-20 NOTE — Progress Notes (Signed)
**  Preliminary report by tech**  Bilateral upper extremity venous duplex completed. 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 There is no evidence of superficial vein thrombosis of the right upper extremity. There is no evidence of deep vein thrombosis involving the left upper extremity. There is evidence of superficial vein thrombosis involving the basiclic vein in the distal upper arm of the left upper extremity.  Results given to the patient's nurse, Miranda.   07/20/16 5:17 PM Olen CordialGreg Islam Villescas RVT
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
## 2016-07-21 MED ORDER — OXYCODONE HCL ER 20 MG PO T12A
20.0000 mg | EXTENDED_RELEASE_TABLET | Freq: Two times a day (BID) | ORAL | Status: DC
Start: 2016-07-21 — End: 2016-07-21
  Administered 2016-07-21: 20 mg via ORAL
  Filled 2016-07-21: qty 1

## 2016-07-21 MED ORDER — HYDROMORPHONE HCL 2 MG PO TABS
2.0000 mg | ORAL_TABLET | ORAL | Status: DC | PRN
  Administered 2016-07-21 (×3): 2 mg via ORAL
  Administered 2016-07-22: 4 mg via ORAL
  Administered 2016-07-22: 2 mg via ORAL
  Administered 2016-07-22: 6 mg via ORAL
  Filled 2016-07-21 (×3): qty 1
  Filled 2016-07-21: qty 3
  Filled 2016-07-21 (×3): qty 1

## 2016-07-21 MED ORDER — HYDROMORPHONE HCL 1 MG/ML IJ SOLN
0.5000 mg | INTRAMUSCULAR | Status: DC | PRN
  Administered 2016-07-21 – 2016-07-28 (×30): 0.5 mg via INTRAVENOUS
  Filled 2016-07-21 (×29): qty 1

## 2016-07-21 MED ORDER — KETOROLAC TROMETHAMINE 30 MG/ML IJ SOLN
30.0000 mg | Freq: Once | INTRAMUSCULAR | Status: AC
  Administered 2016-07-21: 30 mg via INTRAVENOUS
  Filled 2016-07-21: qty 1

## 2016-07-21 MED ORDER — HYDROMORPHONE HCL 1 MG/ML IJ SOLN
INTRAMUSCULAR | Status: AC
  Filled 2016-07-21: qty 1

## 2016-07-21 MED ORDER — APIXABAN 5 MG PO TABS
10.0000 mg | ORAL_TABLET | Freq: Two times a day (BID) | ORAL | Status: AC
  Administered 2016-07-21 – 2016-07-27 (×14): 10 mg via ORAL
  Filled 2016-07-21 (×13): qty 2
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
## 2016-07-21 NOTE — Progress Notes (Signed)
   PATIENT ID: Raynelle [name] Mack   4 Days Post-Op Procedure(s) (LRB): INTRAMEDULLARY (IM) NAIL FEMORAL (Left)  Subjective: Sitting up in chair, working with PT. Tells us that he has both thigh and knee pain. Knee feels better supported in immobilizer. He also feels like dilaudid works better than oxycodone, and feels like it relieves his pain for a longer period of time.   Objective:  Vitals:   07/21/16 0516 07/21/16 1447  BP: (!) 119/55 (!) 128/56  Pulse: (!) 104 (!) 106  Resp: 17 18  Temp: 98.7 F (37.1 C) 98.8 F (37.1 C)     L knee with diffuse swelling, small effusion TTP knee diffusely and hip Dressings c/d/i  Labs:   Recent Labs  07/19/16 0335 07/20/16 0559  HGB 9.6* 9.3*   Recent Labs  07/19/16 0335 07/20/16 0559  WBC 11.1* 8.4  RBC 3.30* 3.16*  HCT 29.1* 27.5*  PLT 180 180   Recent Labs  07/20/16 0559  NA 132*  K 3.6  CL 97*  CO2 29  BUN <5*  CREATININE 0.69  GLUCOSE 104*  CALCIUM 8.2*    Assessment and Plan: 3days s/p left IM femoral nail  Up with PT- knee immobilizer for stability, not required but helps with ambulation and pain control. He can use it as needed Left knee pain- xrays show small suprapatellar effusion, would not rec aspiration as there is not large joint effusion, extensive talk with patient today, likely had ligamentous injury but unable to assess on exam or with MRI, regardless this is treated in an outpatient setting after a few weeks of immobilization/ice/decreasing swelling. Only rec ice and knee immobilizer for treatment at this point WBAT Continue ICE on hip/thigh hematoma Pain control- had an extensive talk with patient about narcotic regimen and pain control, does not believe oxycodone/oxycontin helps, would like to be switched to dilaudid, we will d/c oxy and start dilaudid po for mod pain control with iv dilaudid for breakthough. He is agreeable to this Hx of recent dvt- just came off xalerto a week ago. On lovenox for dvt  prophylaxis and suggest anticoagulation x 3 weeks po Will continue to follow D/c per trauma Fu with Dr. Ave Filterhandler in 2 weeks  VTE proph: Lovenox, SCDs
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
## 2016-07-21 NOTE — Progress Notes (Signed)
Pt off unit unable to get vital signs

## 2016-07-21 NOTE — Progress Notes (Signed)
Rehab Admissions Coordinator Note:  Patient was screened by Trish MageLogue, Ailana Cuadrado M for appropriateness for an Inpatient Acute Rehab Consult.  At this time, we are recommending Inpatient Rehab consult.  Lelon FrohlichLogue, Jalana Moore M 07/21/2016, 5:09 PM  I can be reached at [phone].

## 2016-07-22 ENCOUNTER — Encounter (HOSPITAL_COMMUNITY): Payer: Self-pay | Admitting: Physical Medicine and Rehabilitation

## 2016-07-22 DIAGNOSIS — S069XAA Unspecified intracranial injury with loss of consciousness status unknown, initial encounter: Secondary | ICD-10-CM | POA: Diagnosis present

## 2016-07-22 DIAGNOSIS — I82409 Acute embolism and thrombosis of unspecified deep veins of unspecified lower extremity: Secondary | ICD-10-CM | POA: Diagnosis present

## 2016-07-22 DIAGNOSIS — S069X1S Unspecified intracranial injury with loss of consciousness of 30 minutes or less, sequela: Secondary | ICD-10-CM

## 2016-07-22 DIAGNOSIS — S72102S Unspecified trochanteric fracture of left femur, sequela: Secondary | ICD-10-CM

## 2016-07-22 DIAGNOSIS — S069X9A Unspecified intracranial injury with loss of consciousness of unspecified duration, initial encounter: Secondary | ICD-10-CM | POA: Diagnosis present

## 2016-07-22 MED ORDER — OXYCODONE HCL ER 20 MG PO T12A: 40.0000 mg | EXTENDED_RELEASE_TABLET | Freq: Two times a day (BID) | ORAL | Status: DC

## 2016-07-22 MED ORDER — OXYCODONE HCL 5 MG PO TABS
10.0000 mg | ORAL_TABLET | ORAL | Status: DC | PRN
  Administered 2016-07-22 (×2): 20 mg via ORAL
  Administered 2016-07-23: 10 mg via ORAL
  Administered 2016-07-23 – 2016-07-24 (×4): 20 mg via ORAL
  Administered 2016-07-24: 10 mg via ORAL
  Administered 2016-07-24 – 2016-07-29 (×23): 20 mg via ORAL
  Filled 2016-07-22 (×19): qty 4
  Filled 2016-07-22: qty 2
  Filled 2016-07-22 (×9): qty 4
  Filled 2016-07-22: qty 2
  Filled 2016-07-22: qty 4

## 2016-07-22 MED ORDER — OXYCODONE HCL ER 15 MG PO T12A
30.0000 mg | EXTENDED_RELEASE_TABLET | Freq: Two times a day (BID) | ORAL | Status: DC
  Administered 2016-07-22 – 2016-07-25 (×7): 30 mg via ORAL
  Filled 2016-07-22 (×7): qty 2
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
## 2016-07-22 NOTE — Progress Notes (Signed)
Pt's mom's FMLA forms completed and signed by MD.  Forms faxed to pt's employer, per her request.  Originals returned to mother.  She is appreciative of help.    Quintella BatonJulie W. Larra Crunkleton, RN, BSN  Trauma/Neuro ICU Case Manager [phone]

## 2016-07-22 NOTE — Progress Notes (Signed)
Call placed to Earney HamburgMichael Jeffrey PA regarding pain med administration on pt. At this time pt has consumed 6 mg PO dilaudid at 0738, 0.5 dilaudid at 0910 IV, Oxy ER 30 mg po at 1000 as ordered and scheduled Ultram 100mg  at 1100 and is asking when can he have his oxy IR and his IV dilaudid again.  B/P obtained at 147/68, pt up in recliner by therapy. Earney HamburgMichael Jeffrey PA advised to proceed with oxy IR at this time.  Also tried to explain to pt not to anticipate next pain med needs but to allow current administration of pain meds work their course.
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
## 2016-07-22 NOTE — Progress Notes (Signed)
Inpatient Rehabilitation  Met with patient and parents to discuss team's recommendation for IP Rehab.  Shared booklets and answered questions. Will plan to follow along for timing of medical readiness and bed availability.  I also initiated education regarding brain injury with patient and family.  Plan for my co-worker to follow up Monday.   Carmelia Roller., CCC/SLP Admission Coordinator  [facility]  Cell [phone]

## 2016-07-23 NOTE — Progress Notes (Signed)
Subjective: 6 Days Post-Op Procedure(s) (LRB): INTRAMEDULLARY (IM) NAIL FEMORAL (Left) Patient reports pain as moderate.  Seems improved regarding pain management.  Objective: Vital signs in last 24 hours: Temp:  [99.1 F (37.3 C)-100.2 F (37.9 C)] 99.5 F (37.5 C) (09/30 0535) Pulse Rate:  [111-117] 111 (09/30 0535) Resp:  [17-18] 17 (09/30 0535) BP: (123-135)/(69-78) 123/69 (09/30 0535) SpO2:  [98 %-99 %] 99 % (09/30 0535)  Intake/Output from previous day: 09/29 0701 - 09/30 0700 In: 450 [P.O.:450] Out: 1500 [Urine:1500] Intake/Output this shift: No intake/output data recorded.  No results for input(s): HGB in the last 72 hours. No results for input(s): WBC, RBC, HCT, PLT in the last 72 hours. No results for input(s): NA, K, CL, CO2, BUN, CREATININE, GLUCOSE, CALCIUM in the last 72 hours. No results for input(s): LABPT, INR in the last 72 hours. Left lower extremity exam: Moderate swelling left thigh. Dressings clean and dry. Ankle plantar and dorsiflexion strength is 5/5. Normal sensation distally.   Assessment/Plan: 6 Days Post-Op Procedure(s) (LRB): INTRAMEDULLARY (IM) NAIL FEMORAL (Left) Plan:  Continue physical therapy. WBAT on left. Continue Eliquis Awaiting CIR possibility.  Raymond Brady 07/23/2016, 8:53 AM
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
## 2016-07-24 ENCOUNTER — Encounter (HOSPITAL_COMMUNITY): Payer: Self-pay | Admitting: *Deleted

## 2016-07-24 LAB — CREATININE, SERUM
Creatinine, Ser: 0.74 mg/dL (ref 0.61–1.24)
GFR calc non Af Amer: >60 mL/min (ref >60)
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
## 2016-07-25 ENCOUNTER — Inpatient Hospital Stay (HOSPITAL_COMMUNITY): Payer: No Typology Code available for payment source

## 2016-07-25 MED ORDER — OXYCODONE HCL ER 20 MG PO T12A
40.0000 mg | EXTENDED_RELEASE_TABLET | Freq: Two times a day (BID) | ORAL | Status: DC
  Administered 2016-07-25 – 2016-07-26 (×3): 40 mg via ORAL
  Filled 2016-07-25 (×3): qty 2

## 2016-07-25 NOTE — Progress Notes (Signed)
Pt is c/o of new onset of leg pain and numbness. Reports knee is hurting "more than usual". Pt wearing brace in bed. Will continue to monitor.

## 2016-07-25 NOTE — Progress Notes (Signed)
I visited with the patient and his family at the bedside and observed his PT session.  Pt. is making gains with PT.  We discussed that pt. may progress well enough that he may not need IP Rehab and may be able to discharge to home.  Additionally, we talked about an eventual transition to PO pain meds would be needed prior to CIR,  if he still needs IP Rehab. Pt. asked  what he could use to take the place of IV dilaudid.  We discussed this with pt's RN and she plans to speak with trauma.  I will follow up in the am.   Please call if questions.  Weldon PickingSusan Olive Zmuda PT Inpatient Rehab Admissions Coordinator Cell [phone] Office [phone]
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
## 2016-07-26 ENCOUNTER — Encounter (HOSPITAL_COMMUNITY): Payer: Self-pay

## 2016-07-26 NOTE — Progress Notes (Signed)
Refaxed pt's mom's FMLA paperwork to her employer, per her request.  Mom requested intermittent leave, as pt will need transport to MD appointments at discharge.    Quintella BatonJulie W. Enrique Weiss, RN, BSN  Trauma/Neuro ICU Case Manager [phone]

## 2016-07-26 NOTE — Progress Notes (Signed)
Pt  Refuse suppository last BM 07/17/16 states not passing gas but he has active bowel sounds in 4 quad. Pt also request to have his hydromorphone 0.5mg  IV and 20 mg of Oxi IR along with schedule pain meds he was placed on continuous pulse Ox during the night. Ilean SkillVeronica Kindall Swaby LPN
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
## 2016-07-26 NOTE — Progress Notes (Addendum)
Inpatient Rehabilitation  I have no bed to offer pt. today and I expect limited bed availability for the next day or so.  I have updated the patient as well as Sidney AceJulie Amerson, RNCM.  I anticipate pt. will progress to a level where he won't require CIR before I would have a bed to offer him. I will sign off.  Please call if questions.   Weldon PickingSusan Bilan Tedesco PT Inpatient Rehab Admissions Coordinator Cell [phone] Office [phone]
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
## 2016-07-27 ENCOUNTER — Inpatient Hospital Stay (HOSPITAL_COMMUNITY): Payer: No Typology Code available for payment source

## 2016-07-27 MED ORDER — APIXABAN 5 MG PO TABS
5.0000 mg | ORAL_TABLET | Freq: Two times a day (BID) | ORAL | Status: DC
  Administered 2016-07-28 – 2016-07-29 (×3): 5 mg via ORAL
  Filled 2016-07-27 (×3): qty 1

## 2016-07-27 MED ORDER — OXYCODONE HCL ER 10 MG PO T12A
50.0000 mg | EXTENDED_RELEASE_TABLET | Freq: Two times a day (BID) | ORAL | Status: DC
  Administered 2016-07-27 (×2): 50 mg via ORAL
  Filled 2016-07-27 (×2): qty 1

## 2016-07-27 MED ORDER — POLYETHYLENE GLYCOL 3350 17 G PO PACK
17.0000 g | PACK | Freq: Two times a day (BID) | ORAL | Status: DC
  Administered 2016-07-27 – 2016-07-29 (×5): 17 g via ORAL
  Filled 2016-07-27 (×5): qty 1

## 2016-07-27 MED ORDER — MAGNESIUM CITRATE PO SOLN
1.0000 | Freq: Once | ORAL | Status: AC
  Administered 2016-07-27: 1 via ORAL
  Filled 2016-07-27: qty 296

## 2016-07-27 MED ORDER — DOCUSATE SODIUM 100 MG PO CAPS
200.0000 mg | ORAL_CAPSULE | Freq: Two times a day (BID) | ORAL | Status: DC
  Administered 2016-07-27 – 2016-07-29 (×5): 200 mg via ORAL
  Filled 2016-07-27 (×5): qty 2

## 2016-07-27 NOTE — Progress Notes (Signed)
PT Cancellation Note  Patient Details Name: Raynelle [name] Geier MRN: [id number] DOB: 22-Jun-1991   Cancelled Treatment:    Reason Eval/Treat Not Completed: Patient at procedure or test/unavailable (Pt off unit for MRI of L knee.  )   Avanthika Dehnert Artis DelayJ Rykar Lebleu 07/27/2016, 1:37 PM  Joycelyn RuaAimee Tyjanae Bartek, PTA pager [phone]
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
## 2016-07-27 NOTE — Progress Notes (Signed)
Transport here to take patient to MRI, patient is off unit at this time.

## 2016-07-28 LAB — CBC WITH DIFFERENTIAL/PLATELET
BASOS PCT: 1 %
Basophils Absolute: 0.1 10*3/uL (ref 0.0–0.1)
EOS ABS: 0.3 10*3/uL (ref 0.0–0.7)
Eosinophils Relative: 4 %
HCT: 26.3 % — ABNORMAL LOW (ref 39.0–52.0)
HEMOGLOBIN: 8.5 g/dL — AB (ref 13.0–17.0)
Lymphocytes Relative: 29 %
Lymphs Abs: 2.5 10*3/uL (ref 0.7–4.0)
MCH: 28.8 pg (ref 26.0–34.0)
MCHC: 32.3 g/dL (ref 30.0–36.0)
MCV: 89.2 fL (ref 78.0–100.0)
MONO ABS: 1.1 10*3/uL — AB (ref 0.1–1.0)
MONOS PCT: 12 %
NEUTROS PCT: 54 %
Neutro Abs: 4.7 10*3/uL (ref 1.7–7.7)
PLATELETS: 423 10*3/uL — AB (ref 150–400)
RBC: 2.95 MIL/uL — ABNORMAL LOW (ref 4.22–5.81)
RDW: 13.8 % (ref 11.5–15.5)
WBC: 8.6 10*3/uL (ref 4.0–10.5)

## 2016-07-28 MED ORDER — OXYCODONE HCL ER 20 MG PO T12A
60.0000 mg | EXTENDED_RELEASE_TABLET | Freq: Two times a day (BID) | ORAL | Status: DC
  Administered 2016-07-28 – 2016-07-29 (×3): 60 mg via ORAL
  Filled 2016-07-28 (×3): qty 3
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
## 2016-07-28 NOTE — Progress Notes (Signed)
Inpatient Rehabilitation  Followed up with patient to notify that we now have a bed available to offer him.  Patient stated that he is planning on going home tomorrow.  Discussed his rehab options with him and recommended that he speak with his parents as well.  PT was going to work with patient and I await their updated recommendations.  Plan to follow up tomorrow morning for patient's decision.  Please call with questions.  Charlane FerrettiMelissa Kao Berkheimer, M.A., CCC/SLP Admission Coordinator  [facility]  Cell [phone]
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
## 2016-07-29 MED ORDER — TRAMADOL HCL 50 MG PO TABS: 100.0000 mg | ORAL_TABLET | Freq: Four times a day (QID) | ORAL | 0 refills | Status: DC

## 2016-07-29 MED ORDER — OXYCODONE-ACETAMINOPHEN 10-325 MG PO TABS: 1.0000 | ORAL_TABLET | ORAL | 0 refills | Status: DC | PRN

## 2016-07-29 MED ORDER — APIXABAN 5 MG PO TABS: 5.0000 mg | ORAL_TABLET | Freq: Two times a day (BID) | ORAL | 0 refills | Status: DC

## 2016-07-29 MED ORDER — OXYCODONE HCL ER 20 MG PO T12A: EXTENDED_RELEASE_TABLET | ORAL | 0 refills | Status: DC

## 2016-07-29 MED FILL — traMADol HCL 50 MG TABS: 50 | 14 days supply | Qty: 112 | Fill #0

## 2016-07-29 MED FILL — OXYCODONE-APAP 10-325 TAB: 10-325 | 14 days supply | Qty: 168 | Fill #0

## 2016-07-29 MED FILL — ELIQUIS 5 MG TABLET: 5 | 30 days supply | Qty: 60 | Fill #0

## 2016-07-29 MED FILL — OxyCONTIN 20 MG T12A: 20 | 14 days supply | Qty: 58 | Fill #0
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
## 2016-07-29 NOTE — Discharge Instructions (Addendum)
Apixaban oral tablets °What is this medicine? °APIXABAN (a PIX a ban) is an anticoagulant (blood thinner). It is used to lower the chance of stroke in people with a medical condition called atrial fibrillation. It is also used to treat or prevent blood clots in the lungs or in the veins. °This medicine may be used for other purposes; ask your health care provider or pharmacist if you have questions. °What should I tell my health care provider before I take this medicine? °They need to know if you have any of these conditions: °-bleeding disorders °-bleeding in the brain °-blood in your stools (black or tarry stools) or if you have blood in your vomit °-history of stomach bleeding °-kidney disease °-liver disease °-mechanical heart valve °-an unusual or allergic reaction to apixaban, other medicines, foods, dyes, or preservatives °-pregnant or trying to get pregnant °-breast-feeding °How should I use this medicine? °Take this medicine by mouth with a glass of water. Follow the directions on the prescription label. You can take it with or without food. If it upsets your stomach, take it with food. Take your medicine at regular intervals. Do not take it more often than directed. Do not stop taking except on your doctor's advice. Stopping this medicine may increase your risk of a blot clot. Be sure to refill your prescription before you run out of medicine. °Talk to your pediatrician regarding the use of this medicine in children. Special care may be needed. °Overdosage: If you think you have taken too much of this medicine contact a poison control center or emergency room at once. °NOTE: This medicine is only for you. Do not share this medicine with others. °What if I miss a dose? °If you miss a dose, take it as soon as you can. If it is almost time for your next dose, take only that dose. Do not take double or extra doses. °What may interact with this medicine? °This medicine may interact with the following: °-aspirin  and aspirin-like medicines °-certain medicines for fungal infections like ketoconazole and itraconazole °-certain medicines for seizures like carbamazepine and phenytoin °-certain medicines that treat or prevent blood clots like warfarin, enoxaparin, and dalteparin °-clarithromycin °-NSAIDs, medicines for pain and inflammation, like ibuprofen or naproxen °-rifampin °-ritonavir °-St. John's wort °This list may not describe all possible interactions. Give your health care provider a list of all the medicines, herbs, non-prescription drugs, or dietary supplements you use. Also tell them if you smoke, drink alcohol, or use illegal drugs. Some items may interact with your medicine. °What should I watch for while using this medicine? °Notify your doctor or health care professional and seek emergency treatment if you develop breathing problems; changes in vision; chest pain; severe, sudden headache; pain, swelling, warmth in the leg; trouble speaking; sudden numbness or weakness of the face, arm, or leg. These can be signs that your condition has gotten worse. °If you are going to have surgery, tell your doctor or health care professional that you are taking this medicine. °Tell your health care professional that you use this medicine before you have a spinal or epidural procedure. Sometimes people who take this medicine have bleeding problems around the spine when they have a spinal or epidural procedure. This bleeding is very rare. If you have a spinal or epidural procedure while on this medicine, call your health care professional immediately if you have back pain, numbness or tingling (especially in your legs and feet), muscle weakness, paralysis, or loss of bladder or bowel   control. Avoid sports and activities that might cause injury while you are using this medicine. Severe falls or injuries can cause unseen bleeding. Be careful when using sharp tools or knives. Consider using an Neurosurgeonelectric razor. Take special care  brushing or flossing your teeth. Report any injuries, bruising, or red spots on the skin to your doctor or health care professional. What side effects may I notice from receiving this medicine? Side effects that you should report to your doctor or health care professional as soon as possible: -allergic reactions like skin rash, itching or hives, swelling of the face, lips, or tongue -signs and symptoms of bleeding such as bloody or black, tarry stools; red or dark-brown urine; spitting up blood or brown material that looks like coffee grounds; red spots on the skin; unusual bruising or bleeding from the eye, gums, or nose This list may not describe all possible side effects. Call your doctor for medical advice about side effects. You may report side effects to FDA at 1-800-FDA-1088. Where should I keep my medicine? Keep out of the reach of children. Store at room temperature between 20 and 25 degrees C (68 and 77 degrees F). Throw away any unused medicine after the expiration date. NOTE: This sheet is a summary. It may not cover all possible information. If you have questions about this medicine, talk to your doctor, pharmacist, or health care provider.    2016, Elsevier/Gold Standard. (2013-06-14 11:59:24)   No driving while taking oxycodone. You may bear weight on your leg as tolerated. Use knee immobilized while ambulating for support.
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
## 2016-07-29 NOTE — Plan of Care (Signed)
Problem: Self-Concept: Goal: Verbalizations of decreased anxiety will increase Outcome: Progressing Patient has been calm, relaxed, no signs of anxiety noted

## 2016-07-29 NOTE — Progress Notes (Signed)
Pt belongings and equipment were packed and given to family to take home. Pt is being discharged to home with mother.   International aid/development workerAssistant manager provided pt with discharge information and education. Mother and girlfriend at bedside.    Leonia ReevesNatalie N Lawton Dollinger, RN.
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
## 2016-07-29 NOTE — Care Management Note (Signed)
Case Management Note  Patient Details  Name: Amory Simonetti MRN: [id number] Date of Birth: [birth year]  Subjective/Objective:  Pt medically stable for discharge home today with girlfriend and family assistance.  He does not have a qualifying diagnosis for any home therapies as an uninsured pt.  He is discharging on Eliquis therapy.                    Action/Plan: Attempted to make pt an appointment at [facility], but was told the schedule is "full" for today, and to have pt call on Monday for an appointment next week.  Met with pt; stressed the importance of calling [facility] on Monday for appointment for PCP and assistance with medications.  Pt assures me he will call.  Pt given [place] letter with explanation of [place] program benefits.  Also gave pt 30 day free trial card for second month of Eliquis.  Pt verbalizes understanding of [place] letter and free trial card, and is appreciative of help.  Girlfriend in room, sleeping with head covered.  Referral to [facility] for DME needs under charity program.  DME to be delivered to pt's room prior to dc.    Expected Discharge Date:    07/29/2016              Expected Discharge Plan:  Home/Self Care  In-House Referral:     Discharge planning Services  CM Consult, [facility], [facility] Program, Medication Assistance  Post Acute Care Choice:    Choice offered to:     DME Arranged:  3-N-1, Gilford Rile, [place] DME Agency:  [place]:  [place] Agency:     Status of Service:  Completed, signed off  If discussed at [place] of Stay Meetings, dates discussed:    Additional Comments:  Reinaldo Raddle, RN, BSN  Trauma/Neuro ICU Case Manager [phone]

## 2016-08-04 ENCOUNTER — Ambulatory Visit: Payer: No Typology Code available for payment source | Attending: Internal Medicine | Admitting: Physician Assistant

## 2016-08-04 VITALS — BP 119/70 | HR 119 | Temp 97.6°F | Resp 16 | Wt 140.0 lb

## 2016-08-04 DIAGNOSIS — E871 Hypo-osmolality and hyponatremia: Secondary | ICD-10-CM | POA: Diagnosis not present

## 2016-08-04 DIAGNOSIS — I82621 Acute embolism and thrombosis of deep veins of right upper extremity: Secondary | ICD-10-CM | POA: Diagnosis not present

## 2016-08-04 DIAGNOSIS — Z7901 Long term (current) use of anticoagulants: Secondary | ICD-10-CM | POA: Diagnosis not present

## 2016-08-04 DIAGNOSIS — E781 Pure hyperglyceridemia: Secondary | ICD-10-CM | POA: Diagnosis not present

## 2016-08-04 DIAGNOSIS — D62 Acute posthemorrhagic anemia: Secondary | ICD-10-CM

## 2016-08-04 DIAGNOSIS — S72002G Fracture of unspecified part of neck of left femur, subsequent encounter for closed fracture with delayed healing: Secondary | ICD-10-CM

## 2016-08-04 LAB — COMPREHENSIVE METABOLIC PANEL
ALBUMIN: 3.8 g/dL (ref 3.6–5.1)
ALT: 13 U/L (ref 9–46)
AST: 15 U/L (ref 10–40)
Alkaline Phosphatase: 219 U/L — ABNORMAL HIGH (ref 40–115)
BILIRUBIN TOTAL: 0.6 mg/dL (ref 0.2–1.2)
BUN: 16 mg/dL (ref 7–25)
CO2: 26 mmol/L (ref 20–31)
CREATININE: 0.78 mg/dL (ref 0.60–1.35)
Calcium: 9.2 mg/dL (ref 8.6–10.3)
Chloride: 100 mmol/L (ref 98–110)
Glucose, Bld: 82 mg/dL (ref 65–99)
Potassium: 4.1 mmol/L (ref 3.5–5.3)
SODIUM: 136 mmol/L (ref 135–146)
Total Protein: 6.6 g/dL (ref 6.1–8.1)

## 2016-08-04 LAB — CBC WITH DIFFERENTIAL/PLATELET
BASOS ABS: 67 {cells}/uL (ref 0–200)
BASOS PCT: 1 %
EOS ABS: 201 {cells}/uL (ref 15–500)
Eosinophils Relative: 3 %
HEMATOCRIT: 30 % — AB (ref 38.5–50.0)
Hemoglobin: 9.8 g/dL — ABNORMAL LOW (ref 13.2–17.1)
LYMPHS PCT: 33 %
Lymphs Abs: 2211 cells/uL (ref 850–3900)
MCH: 28 pg (ref 27.0–33.0)
MCHC: 32.7 g/dL (ref 32.0–36.0)
MCV: 85.7 fL (ref 80.0–100.0)
MONO ABS: 1072 {cells}/uL — AB (ref 200–950)
MONOS PCT: 16 %
MPV: 7.9 fL (ref 7.5–12.5)
NEUTROS PCT: 47 %
Neutro Abs: 3149 cells/uL (ref 1500–7800)
PLATELETS: 610 10*3/uL — AB (ref 140–400)
RBC: 3.5 MIL/uL — ABNORMAL LOW (ref 4.20–5.80)
RDW: 14.5 % (ref 11.0–15.0)
WBC: 6.7 10*3/uL (ref 3.8–10.8)

## 2016-08-04 LAB — IRON,TIBC AND FERRITIN PANEL
%SAT: 14 % — ABNORMAL LOW (ref 15–60)
Ferritin: 382 ng/mL — ABNORMAL HIGH (ref 20–345)
Iron: 42 ug/dL — ABNORMAL LOW (ref 50–195)
TIBC: 304 ug/dL (ref 250–425)
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
## 2016-08-04 NOTE — Patient Instructions (Signed)
Slow Fe 2 tabs daily X 2 weeks then 1 daily
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
## 2016-08-09 ENCOUNTER — Telehealth: Payer: Self-pay

## 2016-08-09 NOTE — Telephone Encounter (Signed)
Contacted pt to go over lab results pt didn't answer and was unable to lvm  

## 2016-08-26 ENCOUNTER — Ambulatory Visit: Payer: PRIVATE HEALTH INSURANCE | Attending: Internal Medicine | Admitting: Physician Assistant

## 2016-08-26 VITALS — BP 139/86 | HR 84 | Temp 97.9°F | Resp 16 | Ht 71.0 in | Wt 147.0 lb

## 2016-08-26 DIAGNOSIS — Z86718 Personal history of other venous thrombosis and embolism: Secondary | ICD-10-CM | POA: Insufficient documentation

## 2016-08-26 DIAGNOSIS — D62 Acute posthemorrhagic anemia: Secondary | ICD-10-CM | POA: Insufficient documentation

## 2016-08-26 DIAGNOSIS — Z7901 Long term (current) use of anticoagulants: Secondary | ICD-10-CM | POA: Insufficient documentation

## 2016-08-26 DIAGNOSIS — X58XXXD Exposure to other specified factors, subsequent encounter: Secondary | ICD-10-CM | POA: Insufficient documentation

## 2016-08-26 DIAGNOSIS — S7290XD Unspecified fracture of unspecified femur, subsequent encounter for closed fracture with routine healing: Secondary | ICD-10-CM | POA: Insufficient documentation

## 2016-08-26 LAB — CBC WITH DIFFERENTIAL/PLATELET
BASOS ABS: 0 {cells}/uL (ref 0–200)
Basophils Relative: 0 %
EOS ABS: 77 {cells}/uL (ref 15–500)
Eosinophils Relative: 1 %
HEMATOCRIT: 37.1 % — AB (ref 38.5–50.0)
HEMOGLOBIN: 12.4 g/dL — AB (ref 13.2–17.1)
LYMPHS ABS: 1771 {cells}/uL (ref 850–3900)
Lymphocytes Relative: 23 %
MCH: 28.7 pg (ref 27.0–33.0)
MCHC: 33.4 g/dL (ref 32.0–36.0)
MCV: 85.9 fL (ref 80.0–100.0)
MPV: 9.2 fL (ref 7.5–12.5)
Monocytes Absolute: 693 cells/uL (ref 200–950)
Monocytes Relative: 9 %
NEUTROS ABS: 5159 {cells}/uL (ref 1500–7800)
Neutrophils Relative %: 67 %
Platelets: 259 10*3/uL (ref 140–400)
RBC: 4.32 MIL/uL (ref 4.20–5.80)
RDW: 15 % (ref 11.0–15.0)
WBC: 7.7 10*3/uL (ref 3.8–10.8)

## 2016-08-26 MED ORDER — APIXABAN 5 MG PO TABS: 5.0000 mg | ORAL_TABLET | Freq: Two times a day (BID) | ORAL | 3 refills | Status: DC

## 2016-08-26 MED FILL — ELIQUIS 5 MG TABLET: 5 | 30 days supply | Qty: 60 | Fill #0
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
## 2016-08-26 NOTE — Progress Notes (Signed)
F/u DVT in Rt shoulder. Refill eliquis

## 2016-08-27 LAB — COMPREHENSIVE METABOLIC PANEL
ALT: 10 U/L (ref 9–46)
AST: 13 U/L (ref 10–40)
Albumin: 4.3 g/dL (ref 3.6–5.1)
Alkaline Phosphatase: 238 U/L — ABNORMAL HIGH (ref 40–115)
BILIRUBIN TOTAL: 0.3 mg/dL (ref 0.2–1.2)
BUN: 17 mg/dL (ref 7–25)
CALCIUM: 9.2 mg/dL (ref 8.6–10.3)
CO2: 24 mmol/L (ref 20–31)
Chloride: 105 mmol/L (ref 98–110)
Creat: 1.02 mg/dL (ref 0.60–1.35)
GLUCOSE: 130 mg/dL — AB (ref 65–99)
POTASSIUM: 3.8 mmol/L (ref 3.5–5.3)
Sodium: 139 mmol/L (ref 135–146)
Total Protein: 7.4 g/dL (ref 6.1–8.1)

## 2016-09-02 ENCOUNTER — Telehealth: Payer: Self-pay

## 2016-09-02 NOTE — Telephone Encounter (Signed)
Contacted pt to go over lab results pt was unavailable and was unable to lvm

## 2016-09-06 ENCOUNTER — Telehealth: Payer: Self-pay | Admitting: Internal Medicine

## 2016-09-06 NOTE — Telephone Encounter (Signed)
Tried to reach pt to inform him that rx is ready to be picked up and he needs to do so by 11/17.
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
## 2018-08-11 IMAGING — CR DG FEMUR 1V PORT*L*
1 series · 1 of 1 positions shown · non-contrast
Comparison: None.

CLINICAL DATA: Left femur fracture.

EXAM:
LEFT FEMUR PORTABLE 1 VIEW

[AP]
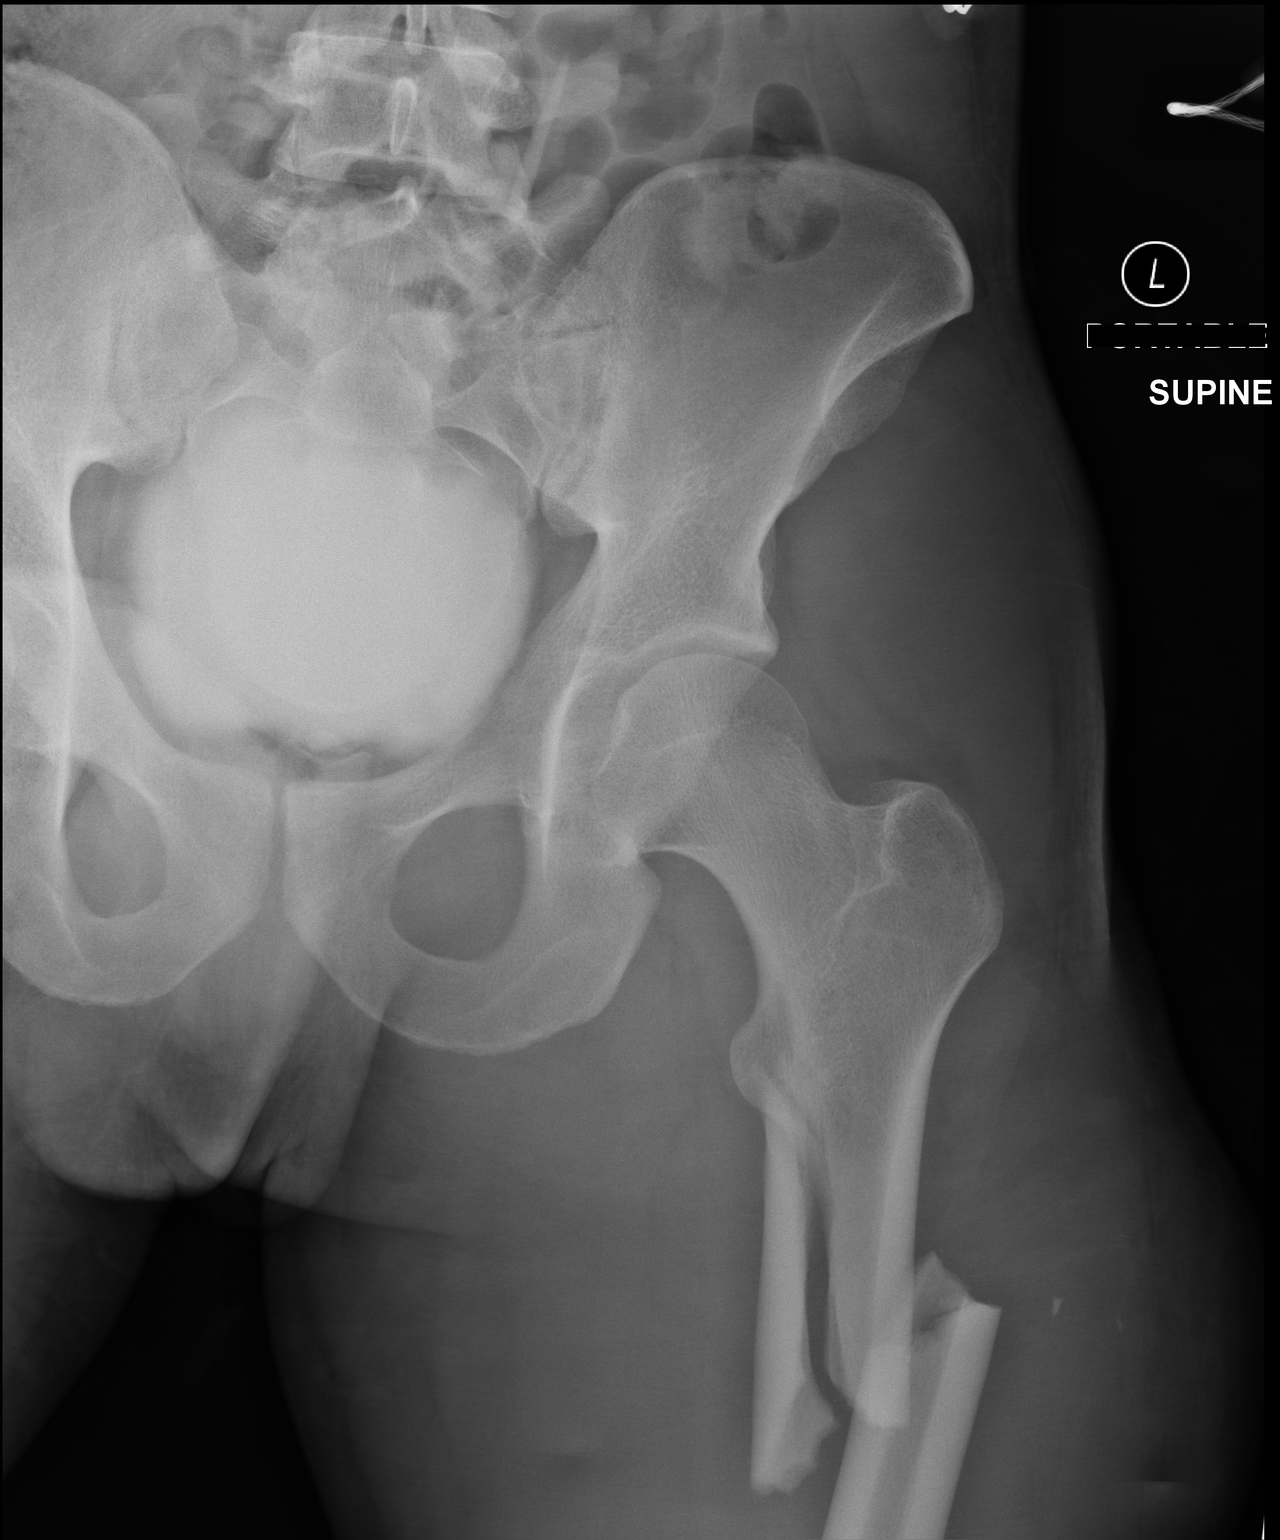

[1 of 1 positions shown; findings below may reference images not displayed]

FINDINGS: Comminuted fractures of proximal left femoral shaft and sub
trochanteric region. There is a mostly transverse fracture of the
proximal femoral shaft with full shaft with lateral displacement,
about 4 cm overriding, and medial angulation of the distal fracture
fragment. There is a longitudinal fracture line extending from this
fracture porch the lesser trochanter. The cortical fragment is
medially displaced. Likely involvement of the lesser trochanter. The
hip joint appears intact. Residual contrast material in the urinary
system consistent with previous CT.
IMPRESSION: Comminuted fractures of the proximal left femoral shaft and sub
trochanteric region with displacement and angulation as described.

## 2018-09-03 ENCOUNTER — Encounter (HOSPITAL_COMMUNITY): Payer: Self-pay | Admitting: Emergency Medicine

## 2018-09-03 ENCOUNTER — Other Ambulatory Visit: Payer: Self-pay

## 2018-09-03 ENCOUNTER — Emergency Department (HOSPITAL_COMMUNITY)
Admission: EM | Admit: 2018-09-03 | Discharge: 2018-09-03 | Disposition: A | Discharge status: Home / Self Care | Payer: PRIVATE HEALTH INSURANCE | Attending: Emergency Medicine | Admitting: Emergency Medicine

## 2018-09-03 DIAGNOSIS — F17210 Nicotine dependence, cigarettes, uncomplicated: Secondary | ICD-10-CM | POA: Insufficient documentation

## 2018-09-03 DIAGNOSIS — F1510 Other stimulant abuse, uncomplicated: Secondary | ICD-10-CM

## 2018-09-03 MED ORDER — ONDANSETRON 4 MG PO TBDP: 4.0000 mg | ORAL_TABLET | Freq: Three times a day (TID) | ORAL | 0 refills | Status: DC | PRN

## 2018-09-03 MED ORDER — ONDANSETRON 4 MG PO TBDP
4.0000 mg | ORAL_TABLET | Freq: Once | ORAL | Status: AC
  Administered 2018-09-03: 4 mg via ORAL
  Filled 2018-09-03: qty 1

## 2018-09-03 NOTE — ED Notes (Signed)
PT states understanding of care given, follow up care, and medication prescribed. PT ambulated from ED to car with a steady gait. 

## 2018-09-03 NOTE — ED Triage Notes (Signed)
Pt from home wanting help to detox from Meth. Pt states "I do Meth all day everyday".  Pt last used on Sunday.
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
## 2023-02-13 ENCOUNTER — Inpatient Hospital Stay: Payer: BC Managed Care – PPO

## 2023-02-13 ENCOUNTER — Inpatient Hospital Stay: Payer: BC Managed Care – PPO | Attending: Oncology | Admitting: Oncology

## 2023-02-13 ENCOUNTER — Encounter: Payer: Self-pay | Admitting: Oncology

## 2023-02-13 VITALS — BP 160/91 | HR 68 | Temp 97.8°F | Resp 18 | Ht 71.0 in | Wt 207.0 lb

## 2023-02-13 DIAGNOSIS — D6859 Other primary thrombophilia: Secondary | ICD-10-CM | POA: Insufficient documentation

## 2023-02-13 DIAGNOSIS — Z7901 Long term (current) use of anticoagulants: Secondary | ICD-10-CM | POA: Diagnosis not present

## 2023-02-13 DIAGNOSIS — I2699 Other pulmonary embolism without acute cor pulmonale: Secondary | ICD-10-CM | POA: Insufficient documentation

## 2023-02-13 DIAGNOSIS — Z87891 Personal history of nicotine dependence: Secondary | ICD-10-CM | POA: Diagnosis not present

## 2023-02-13 DIAGNOSIS — I2693 Single subsegmental pulmonary embolism without acute cor pulmonale: Secondary | ICD-10-CM

## 2023-02-13 DIAGNOSIS — I82621 Acute embolism and thrombosis of deep veins of right upper extremity: Secondary | ICD-10-CM

## 2023-02-13 DIAGNOSIS — K921 Melena: Secondary | ICD-10-CM | POA: Insufficient documentation

## 2023-02-13 LAB — COMPREHENSIVE METABOLIC PANEL
ALT: 33 U/L (ref 0–44)
AST: 23 U/L (ref 15–41)
Albumin: 4.6 g/dL (ref 3.5–5.0)
Alkaline Phosphatase: 55 U/L (ref 38–126)
Anion gap: 8 (ref 5–15)
BUN: 10 mg/dL (ref 6–20)
CO2: 25 mmol/L (ref 22–32)
Calcium: 9.3 mg/dL (ref 8.9–10.3)
Chloride: 105 mmol/L (ref 98–111)
Creatinine, Ser: 0.85 mg/dL (ref 0.61–1.24)
GFR, Estimated: >60 mL/min (ref >60)
Glucose, Bld: 100 mg/dL — ABNORMAL HIGH (ref 70–99)
Potassium: 3.4 mmol/L — ABNORMAL LOW (ref 3.5–5.1)
Sodium: 138 mmol/L (ref 135–145)
Total Bilirubin: 1 mg/dL (ref 0.3–1.2)
Total Protein: 8 g/dL (ref 6.5–8.1)

## 2023-02-13 LAB — CBC WITH DIFFERENTIAL/PLATELET
Abs Immature Granulocytes: 0.01 10*3/uL (ref 0.00–0.07)
Basophils Absolute: 0 10*3/uL (ref 0.0–0.1)
Basophils Relative: 1 %
Eosinophils Absolute: 0.1 10*3/uL (ref 0.0–0.5)
Eosinophils Relative: 1 %
HCT: 45.1 % (ref 39.0–52.0)
Hemoglobin: 15.1 g/dL (ref 13.0–17.0)
Immature Granulocytes: 0 %
Lymphocytes Relative: 28 %
Lymphs Abs: 1.4 10*3/uL (ref 0.7–4.0)
MCH: 29.6 pg (ref 26.0–34.0)
MCHC: 33.5 g/dL (ref 30.0–36.0)
MCV: 88.4 fL (ref 80.0–100.0)
Monocytes Absolute: 0.6 10*3/uL (ref 0.1–1.0)
Monocytes Relative: 12 %
Neutro Abs: 3 10*3/uL (ref 1.7–7.7)
Neutrophils Relative %: 58 %
Platelets: 245 10*3/uL (ref 150–400)
RBC: 5.1 MIL/uL (ref 4.22–5.81)
RDW: 12.7 % (ref 11.5–15.5)
WBC: 5.1 10*3/uL (ref 4.0–10.5)
nRBC: 0 % (ref 0.0–0.2)

## 2023-02-13 LAB — D-DIMER, QUANTITATIVE: D-Dimer, Quant: <0.27 ug/mL-FEU (ref 0.00–0.50)

## 2023-02-13 LAB — FIBRINOGEN: Fibrinogen: 296 mg/dL (ref 210–475)
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
## 2023-02-14 LAB — RHEUMATOID FACTOR: Rheumatoid fact SerPl-aCnc: <10 IU/mL (ref <14.0)

## 2023-02-14 LAB — ANA COMPREHENSIVE PANEL

## 2023-02-14 LAB — FACTOR 8 ASSAY: Coagulation Factor VIII: 84 % (ref 56–140)

## 2023-02-15 DIAGNOSIS — I2699 Other pulmonary embolism without acute cor pulmonale: Secondary | ICD-10-CM | POA: Insufficient documentation

## 2023-02-15 LAB — BETA-2-GLYCOPROTEIN I ABS, IGG/M/A
Beta-2 Glyco I IgG: <9 GPI IgG units (ref 0–20)
Beta-2-Glycoprotein I IgA: <9 GPI IgA units (ref 0–25)
Beta-2-Glycoprotein I IgM: <9 GPI IgM units (ref 0–32)

## 2023-02-15 LAB — CARDIOLIPIN ANTIBODIES, IGM+IGG
Anticardiolipin IgG: <9 GPL U/mL (ref 0–14)
Anticardiolipin IgM: <9 MPL U/mL (ref 0–12)

## 2023-02-23 LAB — PROTHROMBIN GENE MUTATION

## 2023-03-01 ENCOUNTER — Encounter: Payer: Self-pay | Admitting: Oncology

## 2023-03-07 ENCOUNTER — Encounter: Payer: Self-pay | Admitting: Oncology

## 2023-03-07 ENCOUNTER — Inpatient Hospital Stay: Payer: BC Managed Care – PPO

## 2023-03-07 ENCOUNTER — Inpatient Hospital Stay: Payer: BC Managed Care – PPO | Attending: Oncology | Admitting: Oncology

## 2023-03-07 VITALS — BP 145/89 | HR 81 | Temp 98.9°F | Resp 14 | Ht 71.0 in | Wt 201.0 lb

## 2023-03-07 DIAGNOSIS — Z5181 Encounter for therapeutic drug level monitoring: Secondary | ICD-10-CM

## 2023-03-07 DIAGNOSIS — Z86711 Personal history of pulmonary embolism: Secondary | ICD-10-CM | POA: Insufficient documentation

## 2023-03-07 DIAGNOSIS — I2693 Single subsegmental pulmonary embolism without acute cor pulmonale: Secondary | ICD-10-CM | POA: Diagnosis not present

## 2023-03-07 DIAGNOSIS — Z86718 Personal history of other venous thrombosis and embolism: Secondary | ICD-10-CM | POA: Insufficient documentation

## 2023-03-07 DIAGNOSIS — I82621 Acute embolism and thrombosis of deep veins of right upper extremity: Secondary | ICD-10-CM

## 2023-03-07 DIAGNOSIS — Z7901 Long term (current) use of anticoagulants: Secondary | ICD-10-CM | POA: Insufficient documentation

## 2023-03-07 DIAGNOSIS — D6859 Other primary thrombophilia: Secondary | ICD-10-CM

## 2023-03-07 LAB — D-DIMER, QUANTITATIVE: D-Dimer, Quant: <0.27 ug/mL-FEU (ref 0.00–0.50)

## 2023-03-07 NOTE — Patient Instructions (Addendum)
Continue Eliquis 2.5 mg twice daily then stop it 1 week prior to your next lab draw
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
## 2023-03-08 ENCOUNTER — Telehealth: Payer: Self-pay | Admitting: Oncology

## 2023-03-08 NOTE — Telephone Encounter (Signed)
.  Contacted pt to schedule an appt per 03/07/23 LOS. Unable to reach via phone, voicemail was left.

## 2023-04-17 ENCOUNTER — Inpatient Hospital Stay: Payer: BC Managed Care – PPO | Attending: Oncology
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
## 2023-04-19 ENCOUNTER — Inpatient Hospital Stay: Payer: BC Managed Care – PPO | Admitting: Oncology

## 2024-09-24 NOTE — Telephone Encounter (Signed)
 Wrong office

## 2024-09-24 NOTE — Telephone Encounter (Signed)
 Correcting routing.  This has already been addressed, please see other encounter.
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
## 2024-10-12 ENCOUNTER — Ambulatory Visit (HOSPITAL_COMMUNITY): Admission: EM | Admit: 2024-10-12 | Discharge: 2024-10-12 | Disposition: A | Discharge status: Home / Self Care

## 2024-10-12 DIAGNOSIS — F331 Major depressive disorder, recurrent, moderate: Secondary | ICD-10-CM | POA: Diagnosis not present
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
## 2024-10-12 NOTE — ED Notes (Signed)
 Pt given AVS w/ no questions after received and discussed.

## 2024-10-12 NOTE — Discharge Instructions (Addendum)
 [facility]  [address],  [place], [place] [address] [phone]  [facility]    [address]  [place], [place] [address] [phone]  [facility]  [address] [place], [place] [address]  Get help right away if: You have thoughts about hurting yourself or others. Get help right away if you feel like you may hurt yourself or others, or have thoughts about taking your own life. Go to your nearest emergency room or: Call 911. Call the National Suicide Prevention Lifeline at (289) 066-4106 or 988 in the [place] This is open 24 hours a day. If youre a Veteran: Call 988 and press 1. This is open 24 hours a day. Text the [employer] at (873)405-5844. Summary Mental health is not just the absence of mental illness. It involves understanding your emotions and behaviors, and taking steps to manage them in a healthy way. If you have symptoms of mental or emotional distress, get help from family, friends, a health care provider, or a mental health professional. Practice good mental health behaviors such as stress management skills, self-calming skills, exercise, healthy sleeping and eating, and supportive relationships. This information is not intended to replace advice given to you by your health care provider. Make sure you discuss any questions you have with your health care provider.  Education provided on the fact that if experiencing worsening of psychiatry symptoms including suicidal ideations, homicidal ideations, or having auditory/visual hallucinations, etc, to call 911, 988, come back to this location, or go to the nearest ER. Pt verbalized understanding.

## 2024-10-22 ENCOUNTER — Ambulatory Visit (HOSPITAL_COMMUNITY): Admitting: Licensed Clinical Social Worker

## 2024-10-22 DIAGNOSIS — F329 Major depressive disorder, single episode, unspecified: Secondary | ICD-10-CM
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
